# Patient Record
Sex: Female | Born: 1965 | Race: White | Hispanic: No | Marital: Single | Smoking: Current every day smoker
Health system: Southern US, Community
[De-identification: ages and names within clinical notes are randomized; demographics above are authoritative.]

## PROBLEM LIST (undated history)

## (undated) DIAGNOSIS — E119 Type 2 diabetes mellitus without complications: Secondary | ICD-10-CM

## (undated) DIAGNOSIS — R000 Tachycardia, unspecified: Secondary | ICD-10-CM

## (undated) DIAGNOSIS — G629 Polyneuropathy, unspecified: Secondary | ICD-10-CM

## (undated) HISTORY — PX: KNEE SURGERY: SHX244

---

## 2018-01-12 ENCOUNTER — Emergency Department: Payer: Medicaid Other

## 2018-01-12 ENCOUNTER — Emergency Department
Admission: EM | Admit: 2018-01-12 | Discharge: 2018-01-12 | Disposition: A | Discharge status: Home / Self Care | Payer: Medicaid Other | Attending: Emergency Medicine | Admitting: Emergency Medicine

## 2018-01-12 ENCOUNTER — Encounter: Payer: Self-pay | Admitting: Emergency Medicine

## 2018-01-12 DIAGNOSIS — F17210 Nicotine dependence, cigarettes, uncomplicated: Secondary | ICD-10-CM | POA: Insufficient documentation

## 2018-01-12 DIAGNOSIS — R52 Pain, unspecified: Secondary | ICD-10-CM

## 2018-01-12 DIAGNOSIS — Z79899 Other long term (current) drug therapy: Secondary | ICD-10-CM | POA: Insufficient documentation

## 2018-01-12 DIAGNOSIS — W208XXA Other cause of strike by thrown, projected or falling object, initial encounter: Secondary | ICD-10-CM | POA: Diagnosis not present

## 2018-01-12 DIAGNOSIS — Z794 Long term (current) use of insulin: Secondary | ICD-10-CM | POA: Diagnosis not present

## 2018-01-12 DIAGNOSIS — E119 Type 2 diabetes mellitus without complications: Secondary | ICD-10-CM | POA: Insufficient documentation

## 2018-01-12 DIAGNOSIS — Z711 Person with feared health complaint in whom no diagnosis is made: Secondary | ICD-10-CM | POA: Insufficient documentation

## 2018-01-12 DIAGNOSIS — W19XXXA Unspecified fall, initial encounter: Secondary | ICD-10-CM

## 2018-01-12 DIAGNOSIS — M7918 Myalgia, other site: Secondary | ICD-10-CM | POA: Diagnosis not present

## 2018-01-12 HISTORY — DX: Polyneuropathy, unspecified: G62.9

## 2018-01-12 HISTORY — DX: Tachycardia, unspecified: R00.0

## 2018-01-12 HISTORY — DX: Type 2 diabetes mellitus without complications: E11.9

## 2018-01-12 MED ORDER — CYCLOBENZAPRINE HCL 5 MG PO TABS: ORAL_TABLET | ORAL | 0 refills | Status: AC

## 2018-01-12 MED ORDER — OXYCODONE-ACETAMINOPHEN 5-325 MG PO TABS
1.0000 | ORAL_TABLET | Freq: Once | ORAL | Status: AC
  Administered 2018-01-12: 1 via ORAL
  Filled 2018-01-12: qty 1

## 2018-01-12 NOTE — ED Triage Notes (Signed)
Patient ambulatory to triage. Patient states that a couple of hours ago a shelf fell down on her in her house and caused her to fall. Patient with complaint of lower back pain, left shoulder pain and left ankle pain.

## 2018-01-12 NOTE — ED Provider Notes (Signed)
[facility] Emergency Department Provider Note  ____________________________________________  Time seen: Approximately 8:27 AM  I have reviewed the triage vital signs and the nursing notes.   HISTORY  Chief Complaint Fall    HPI Jenny Lewis is a 53 y.o. female that presents to emergency department for evaluation of back pain and ankle pain after falling last night.  Patient states that a shelf fell onto another shelf next to the washer and fell onto her pushing her backwards.  She fell up against the wall on her back.  She states that she has not been able to walk on her ankle since.  She does not think that anything is broken but came to the emergency department to make sure that she did not "punctured a lung." Low back pain is the worst.  No alleviating measures have been attempted.  Past Medical History:  Diagnosis Date  . Diabetes mellitus without complication (HCC)   . Peripheral neuropathy   . Tachycardia     There are no active problems to display for this patient.   Past Surgical History:  Procedure Laterality Date  . KNEE SURGERY Right    times 2    Prior to Admission medications   Medication Sig Start Date End Date Taking? Authorizing Provider  ALPRAZolam Prudy Feeler) 0.5 MG tablet Take 0.5 mg by mouth at bedtime as needed for anxiety.   Yes [provider]  amitriptyline (ELAVIL) 100 MG tablet Take 100 mg by mouth at bedtime.   Yes [provider]  atenolol (TENORMIN) 50 MG tablet Take 50 mg by mouth daily.   Yes [provider]  gabapentin (NEURONTIN) 300 MG capsule Take 300 mg by mouth 3 (three) times daily.   Yes [provider]  insulin glargine (LANTUS) 100 UNIT/ML injection Inject into the skin daily.   Yes [provider]  insulin lispro (HUMALOG) 100 UNIT/ML injection Inject into the skin 3 (three) times daily before meals.   Yes [provider]  cyclobenzaprine (FLEXERIL) 5 MG tablet  Take 1-2 tablets 3 times daily as needed 01/12/18   Enid Derry, PA-C    Allergies Nickel  No family history on file.  Social History Social History   Tobacco Use  . Smoking status: Current Every Day Smoker  . Smokeless tobacco: Never Used  Substance Use Topics  . Alcohol use: Not on file  . Drug use: Not on file     Review of Systems  Cardiovascular: No chest pain. Respiratory: No SOB. Gastrointestinal: No abdominal pain.  No vomiting.  Musculoskeletal: Positive for back pain and ankle pain. Skin: Negative for rash, abrasions, lacerations, ecchymosis. Neurological: Negative for numbness or tingling   ____________________________________________   PHYSICAL EXAM:  VITAL SIGNS: ED Triage Vitals  Enc Vitals Group     BP 01/12/18 0646 129/82     Pulse Rate 01/12/18 0646 (!) 107     Resp 01/12/18 0646 18     Temp 01/12/18 0646 98.1 F (36.7 C)     Temp Source 01/12/18 0646 Oral     SpO2 01/12/18 0646 100 %     Weight 01/12/18 0647 146 lb (66.2 kg)     Height 01/12/18 0647 4\' 11"  (1.499 m)     Head Circumference --      Peak Flow --      Pain Score 01/12/18 0647 8     Pain Loc --      Pain Edu? --      Excl.  in GC? --      Constitutional: Alert and oriented. Well appearing and in no acute distress. Eyes: Conjunctivae are normal. PERRL. EOMI. Head: Atraumatic. ENT:      Ears:      Nose: No congestion/rhinnorhea.      Mouth/Throat: Mucous membranes are moist.  Neck: No stridor.  No cervical spine tenderness to palpation. Cardiovascular: Normal rate, regular rhythm.  Good peripheral circulation. Respiratory: Normal respiratory effort without tachypnea or retractions. Lungs CTAB. Good air entry to the bases with no decreased or absent breath sounds. Gastrointestinal: Bowel sounds 4 quadrants. Soft and nontender to palpation. No guarding or rigidity. No palpable masses. No distention.  Musculoskeletal: Full range of motion to all extremities. No gross  deformities appreciated.  Diffuse tenderness to palpation to upper and lower back and unable to elicit any pinpoint tenderness.  Full range of motion of left ankle.  No swelling or ecchymosis. Neurologic:  Normal speech and language. No gross focal neurologic deficits are appreciated.  Skin:  Skin is warm, dry and intact. No rash noted. Psychiatric: Mood and affect are normal. Speech and behavior are normal. Patient exhibits appropriate insight and judgement.   ____________________________________________   LABS (all labs ordered are listed, but only abnormal results are displayed)  Labs Reviewed - No data to display ____________________________________________  EKG   ____________________________________________  RADIOLOGY Lexine BatonI, Shonda Mandarino, personally viewed and evaluated these images (plain radiographs) as part of my medical decision making, as well as reviewing the written report by the radiologist.  Dg Chest 2 View  Result Date: 01/12/2018 CLINICAL DATA:  Left-sided rib pain after fall. EXAM: CHEST - 2 VIEW COMPARISON:  None. FINDINGS: The heart size and mediastinal contours are within normal limits. Both lungs are clear. No pneumothorax or pleural effusion is noted. Old right rib fractures are noted. IMPRESSION: No active cardiopulmonary disease. Electronically Signed   By: Lupita RaiderJames  Green Jr, M.D.   On: 01/12/2018 09:02   Dg Lumbar Spine 2-3 Views  Result Date: 01/12/2018 CLINICAL DATA:  Low back pain after fall. EXAM: LUMBAR SPINE - 2-3 VIEW COMPARISON:  None. FINDINGS: There is no evidence of lumbar spine fracture. Alignment is normal. Intervertebral disc spaces are maintained. IMPRESSION: Negative. Electronically Signed   By: Lupita RaiderJames  Green Jr, M.D.   On: 01/12/2018 09:04   Dg Ankle Complete Right  Result Date: 01/12/2018 CLINICAL DATA:  Right ankle pain after fall. EXAM: RIGHT ANKLE - COMPLETE 3+ VIEW COMPARISON:  None. FINDINGS: There is no evidence of fracture, dislocation, or  joint effusion. There is no evidence of arthropathy or other focal bone abnormality. Soft tissues are unremarkable. IMPRESSION: Negative. Electronically Signed   By: Lupita RaiderJames  Green Jr, M.D.   On: 01/12/2018 09:05    ____________________________________________    PROCEDURES  Procedure(s) performed:    Procedures    Medications  oxyCODONE-acetaminophen (PERCOCET/ROXICET) 5-325 MG per tablet 1 tablet (1 tablet Oral Given 01/12/18 0820)     ____________________________________________   INITIAL IMPRESSION / ASSESSMENT AND PLAN / ED COURSE  Pertinent labs & imaging results that were available during my care of the patient were reviewed by me and considered in my medical decision making (see chart for details).  Review of the [place] CSRS was performed in accordance of the [facility] prior to dispensing any controlled drugs.     Patient presented the emergency department for evaluation after fall.  Vital signs and exam are reassuring.  Just, lumbar, ankle x-ray are negative for acute abnormalities.  Patient appears  well.  Patient takes Percocet for chronic pain and will continue this medication.  Patient will be discharged home with prescriptions for Flexeril. Patient is to follow up with primary care as directed. Patient is given ED precautions to return to the ED for any worsening or new symptoms.     ____________________________________________  FINAL CLINICAL IMPRESSION(S) / ED DIAGNOSES  Final diagnoses:  Fall, initial encounter      NEW MEDICATIONS STARTED DURING THIS VISIT:  ED Discharge Orders         Ordered    cyclobenzaprine (FLEXERIL) 5 MG tablet     01/12/18 0919              This chart was dictated using voice recognition software/Dragon. Despite best efforts to proofread, errors can occur which can change the meaning. Any change was purely unintentional.    Enid Derry, PA-C 01/12/18 1312    Rockne Menghini, MD 01/12/18 [phone]

## 2018-01-12 NOTE — ED Notes (Signed)
See triage note  States she couple of shelves fall from wall  And then on to her   She fell back  Having back pain left shoulder and left ankle pain  States she was not able to bear wt   No deformity noted

## 2019-09-19 IMAGING — CR DG ANKLE COMPLETE 3+V*R*
3 series · 3 of 3 positions shown · non-contrast
Comparison: None.

CLINICAL DATA: Right ankle pain after fall.

EXAM:
RIGHT ANKLE - COMPLETE 3+ VIEW

[ankle ap]
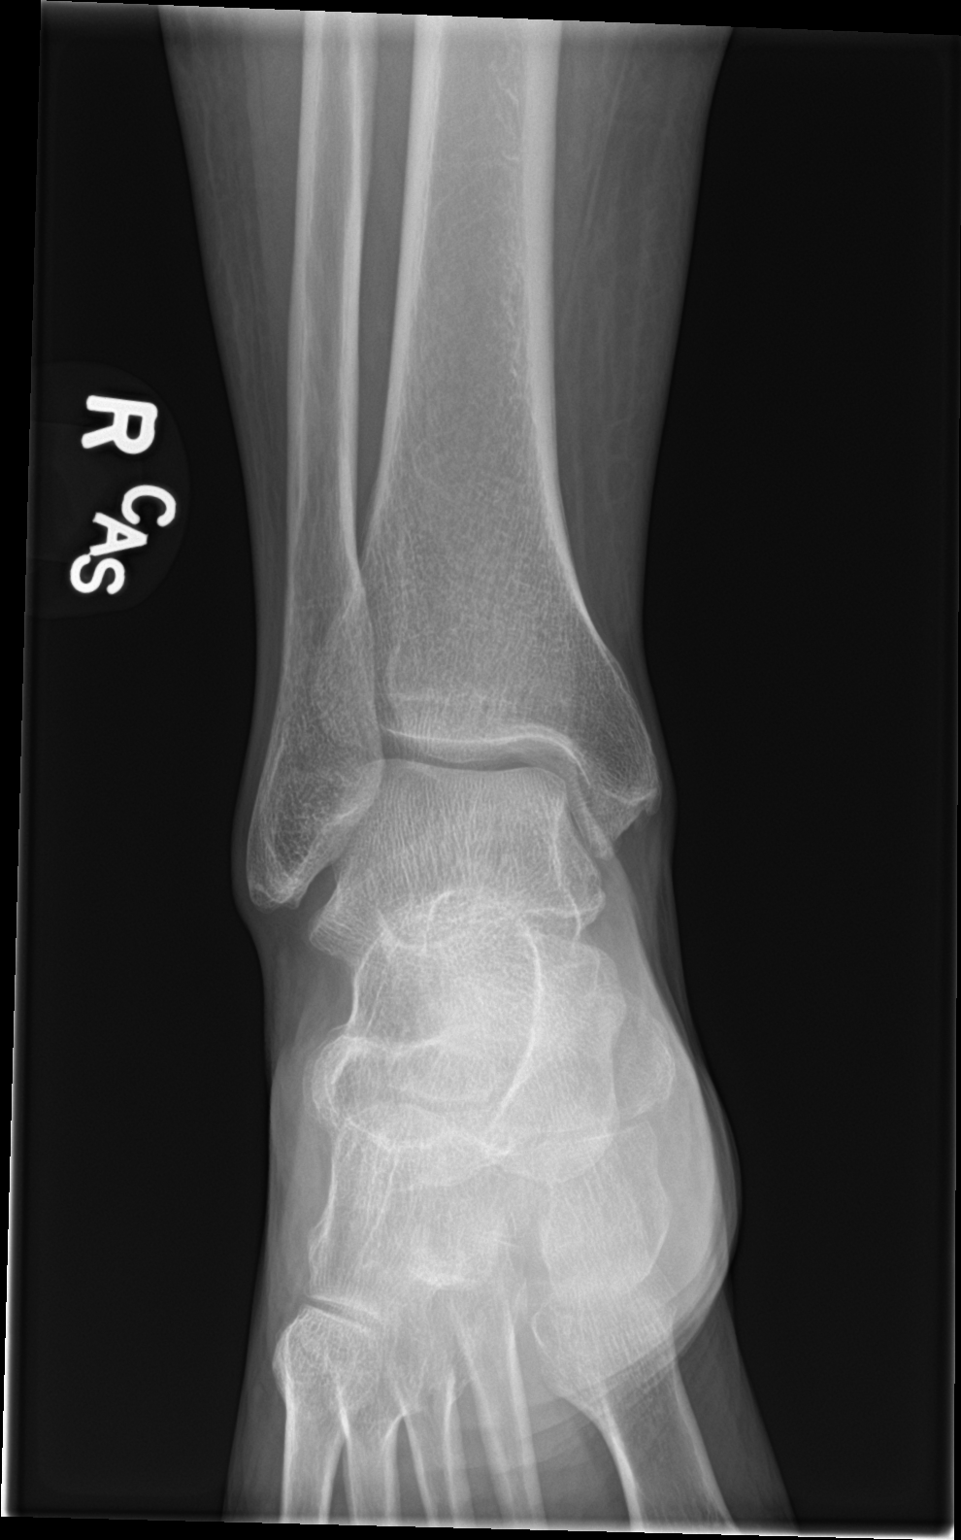

[ankle obl]
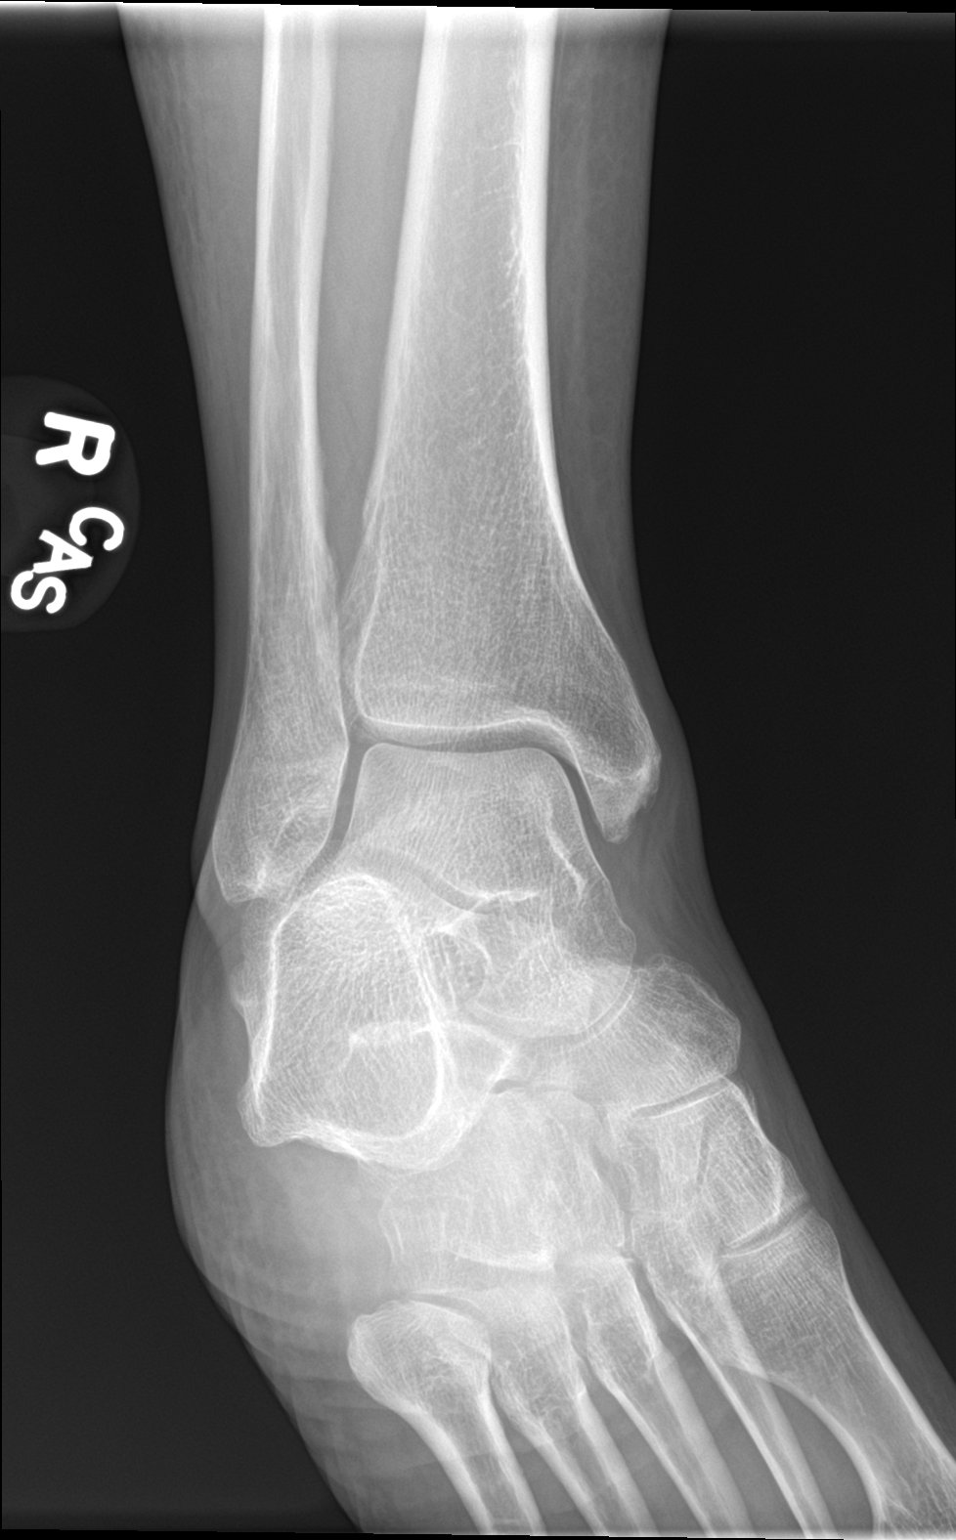

[ankle lat]
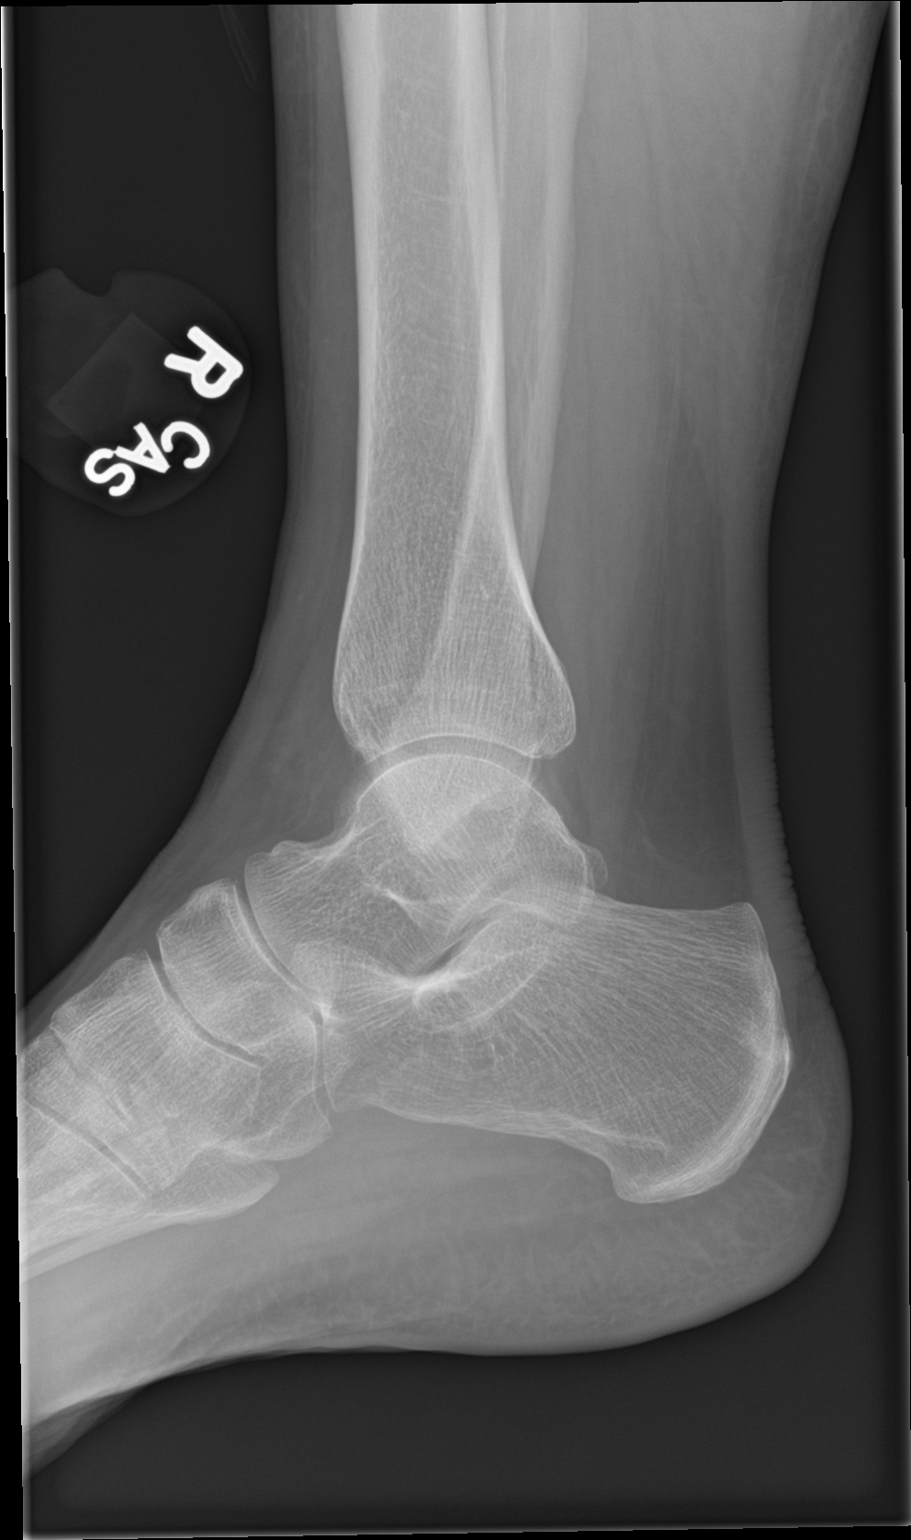

[3 of 3 positions shown; findings below may reference images not displayed]

FINDINGS: There is no evidence of fracture, dislocation, or joint effusion.
There is no evidence of arthropathy or other focal bone abnormality.
Soft tissues are unremarkable.
IMPRESSION: Negative.

## 2022-07-01 DIAGNOSIS — L02512 Cutaneous abscess of left hand: Secondary | ICD-10-CM | POA: Diagnosis not present

## 2022-07-01 DIAGNOSIS — E1165 Type 2 diabetes mellitus with hyperglycemia: Secondary | ICD-10-CM | POA: Diagnosis not present

## 2022-07-01 DIAGNOSIS — E1140 Type 2 diabetes mellitus with diabetic neuropathy, unspecified: Secondary | ICD-10-CM | POA: Diagnosis not present

## 2022-07-01 DIAGNOSIS — L0291 Cutaneous abscess, unspecified: Secondary | ICD-10-CM | POA: Diagnosis not present

## 2022-07-01 DIAGNOSIS — E871 Hypo-osmolality and hyponatremia: Secondary | ICD-10-CM | POA: Diagnosis not present

## 2022-07-01 DIAGNOSIS — A419 Sepsis, unspecified organism: Secondary | ICD-10-CM | POA: Diagnosis not present

## 2022-07-01 DIAGNOSIS — E872 Acidosis, unspecified: Secondary | ICD-10-CM | POA: Diagnosis not present

## 2022-07-01 DIAGNOSIS — S61200A Unspecified open wound of right index finger without damage to nail, initial encounter: Secondary | ICD-10-CM | POA: Diagnosis not present

## 2022-07-01 DIAGNOSIS — M799 Soft tissue disorder, unspecified: Secondary | ICD-10-CM | POA: Diagnosis not present

## 2022-07-01 DIAGNOSIS — L02416 Cutaneous abscess of left lower limb: Secondary | ICD-10-CM | POA: Diagnosis not present

## 2022-07-01 DIAGNOSIS — Z89612 Acquired absence of left leg above knee: Secondary | ICD-10-CM | POA: Diagnosis not present

## 2022-07-01 DIAGNOSIS — L0390 Cellulitis, unspecified: Secondary | ICD-10-CM | POA: Diagnosis not present

## 2022-07-01 DIAGNOSIS — L03116 Cellulitis of left lower limb: Secondary | ICD-10-CM | POA: Diagnosis not present

## 2022-07-05 DIAGNOSIS — A419 Sepsis, unspecified organism: Secondary | ICD-10-CM | POA: Diagnosis not present

## 2022-07-05 DIAGNOSIS — L02511 Cutaneous abscess of right hand: Secondary | ICD-10-CM | POA: Diagnosis not present

## 2022-07-05 DIAGNOSIS — L03116 Cellulitis of left lower limb: Secondary | ICD-10-CM | POA: Diagnosis not present
